# Patient Record
Sex: Female | Born: 2000 | Race: White | Hispanic: No | Marital: Single | Smoking: Never smoker
Health system: Southern US, Community
[De-identification: ages and names within clinical notes are randomized; demographics above are authoritative.]

## PROBLEM LIST (undated history)

## (undated) DIAGNOSIS — K529 Noninfective gastroenteritis and colitis, unspecified: Secondary | ICD-10-CM

## (undated) HISTORY — DX: Noninfective gastroenteritis and colitis, unspecified: K52.9

## (undated) HISTORY — PX: KNEE SURGERY: SHX244

---
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
## 2022-04-08 IMAGING — CT CT RENAL STONE PROTOCOL
2 of 4 series · 15 of 46 positions shown, 17 images · non-contrast
Comparison: Chest radiographs 02/11/2018.

CLINICAL DATA: 19-year-old female with left side abdominal and
flank pain beginning 2 days ago. Recently tested negative for
[id number] and strep.

EXAM:
CT ABDOMEN AND PELVIS WITHOUT CONTRAST
TECHNIQUE: Multidetector CT imaging of the abdomen and pelvis was performed
following the standard protocol without IV contrast.

[Series 2: axial st · axial · 0.57mm/px · z∈[-765,-400]mm · 12 of 84 slices shown, 14 images]
[im 7/84  soft-tissue]
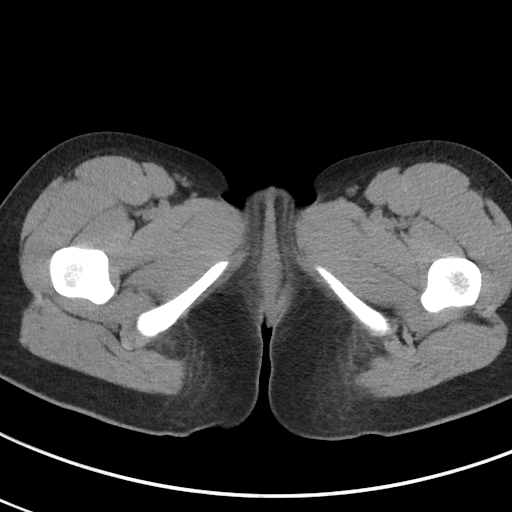
[im 7/84  bone]
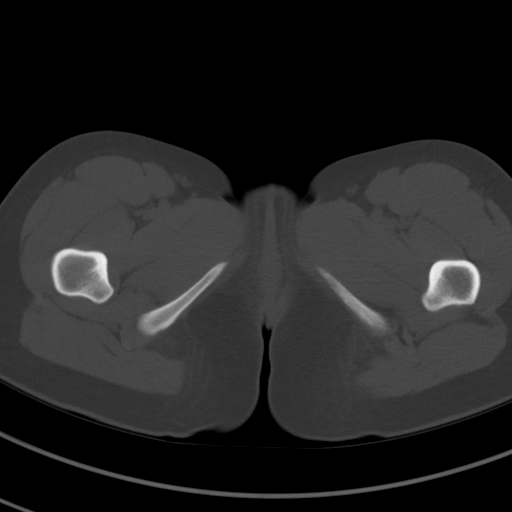
[im 14/84  soft-tissue]
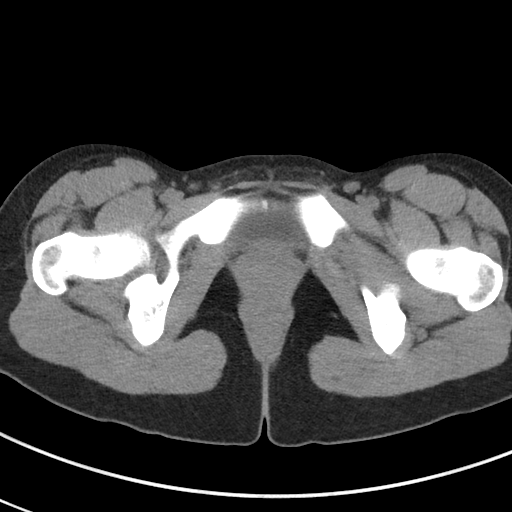
[im 20/84  soft-tissue]
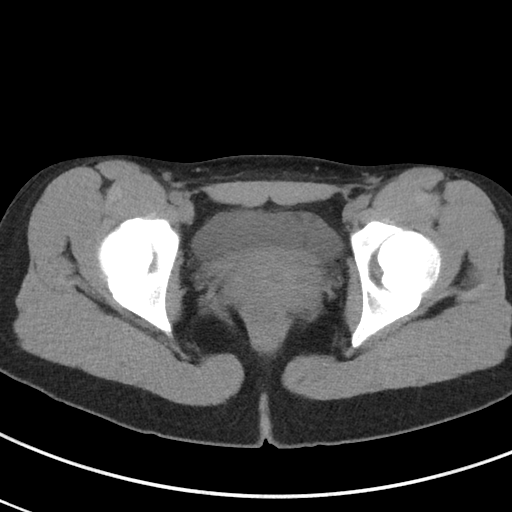
[im 27/84  soft-tissue]
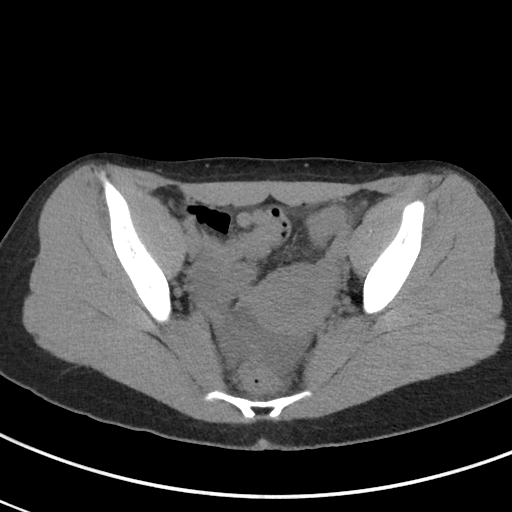
[im 34/84  soft-tissue]
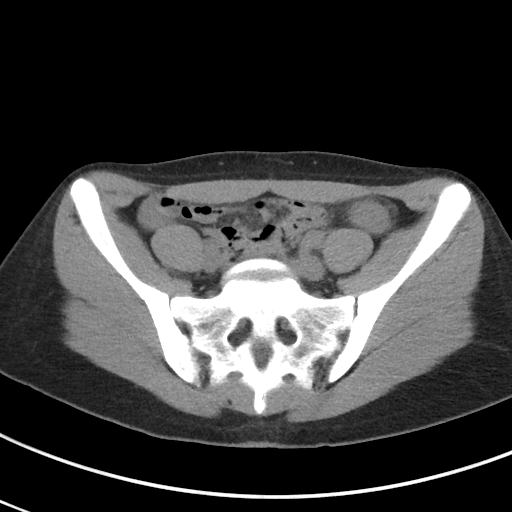
[im 40/84  soft-tissue]
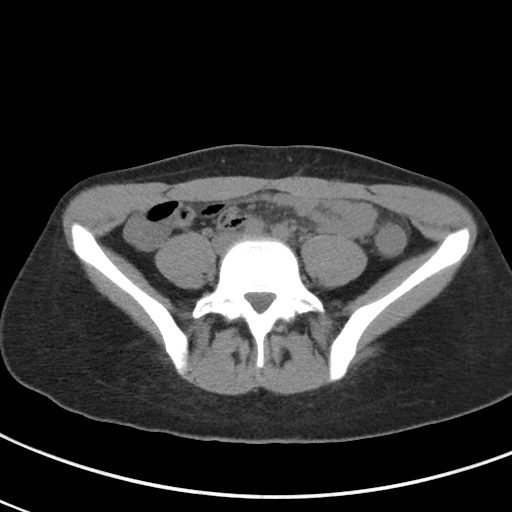
[im 47/84  soft-tissue]
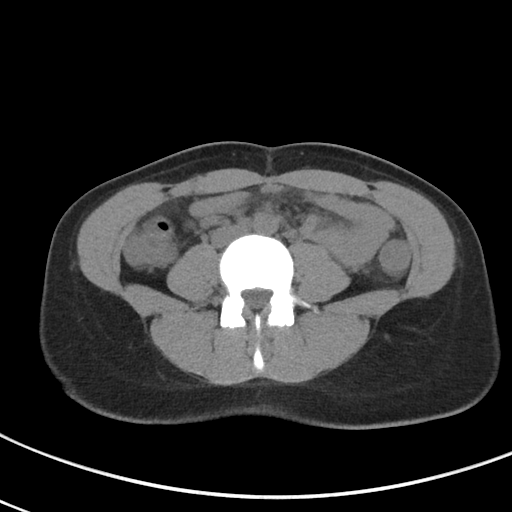
[im 54/84  soft-tissue]
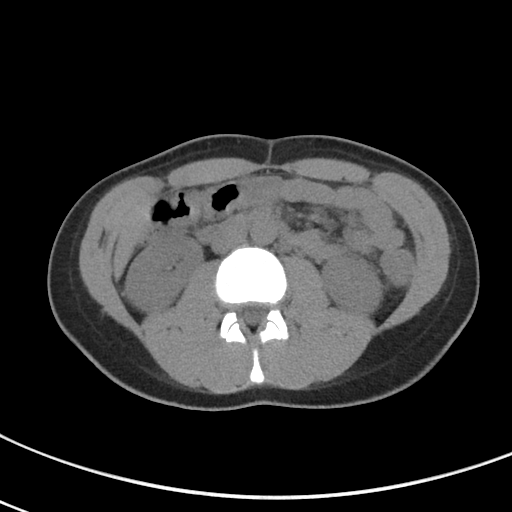
[im 60/84  soft-tissue]
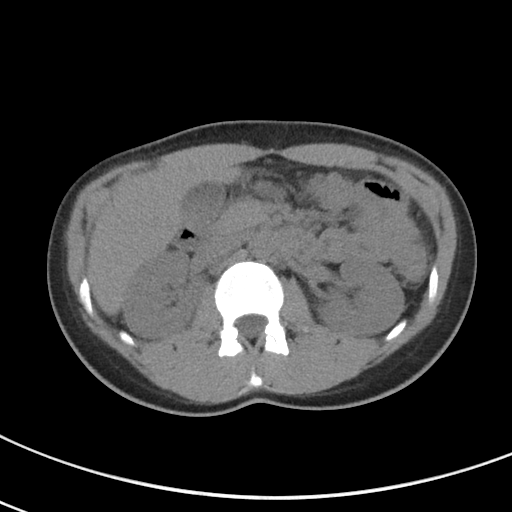
[im 60/84  bone]
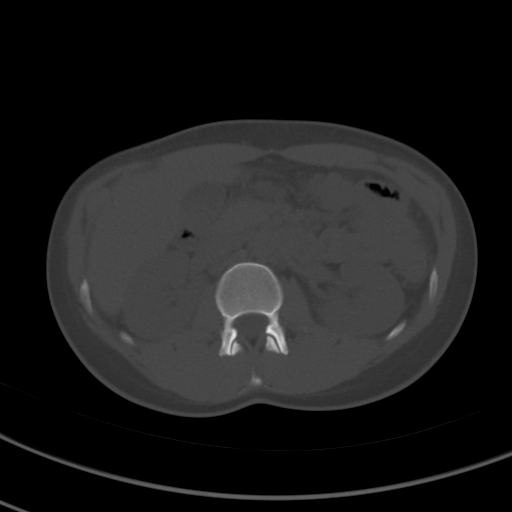
[im 67/84  soft-tissue]
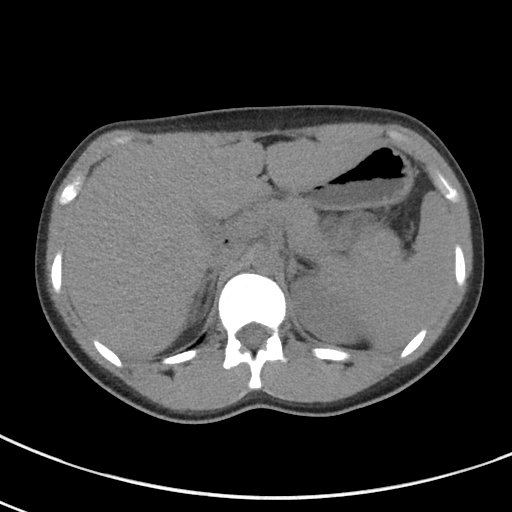
[im 74/84  soft-tissue]
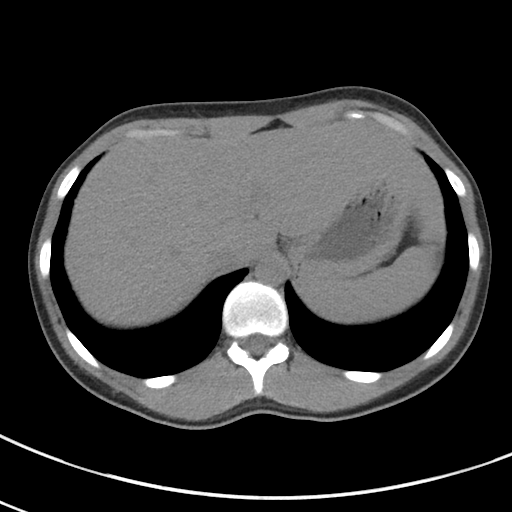
[im 80/84  soft-tissue]
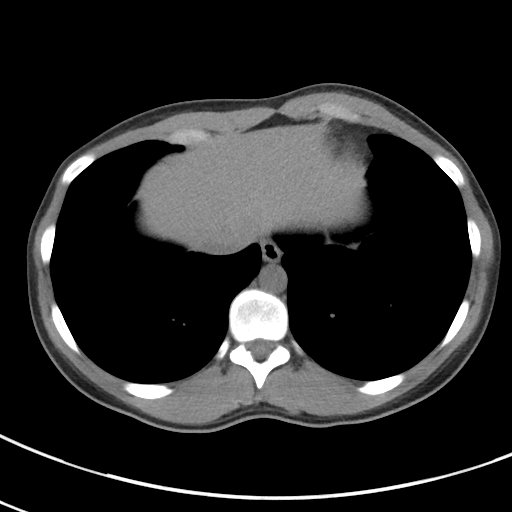

[Series 4: coronal st · coronal · 0.62mm/px · 3 of 85 slices shown]
[im 29/85  soft-tissue]
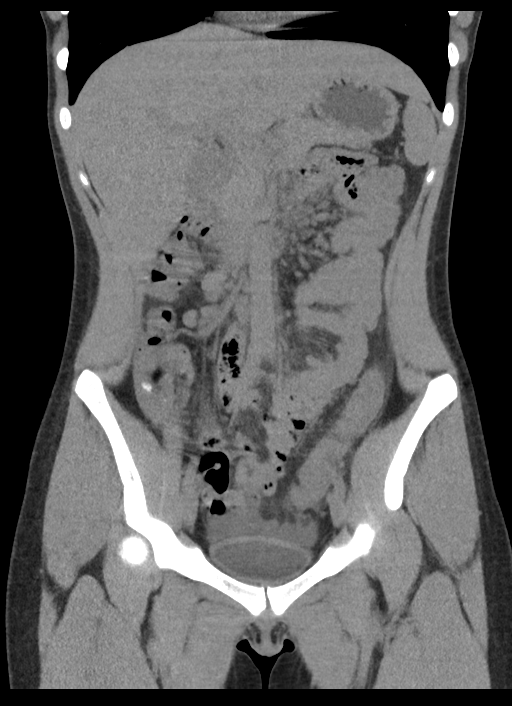
[im 38/85  soft-tissue]
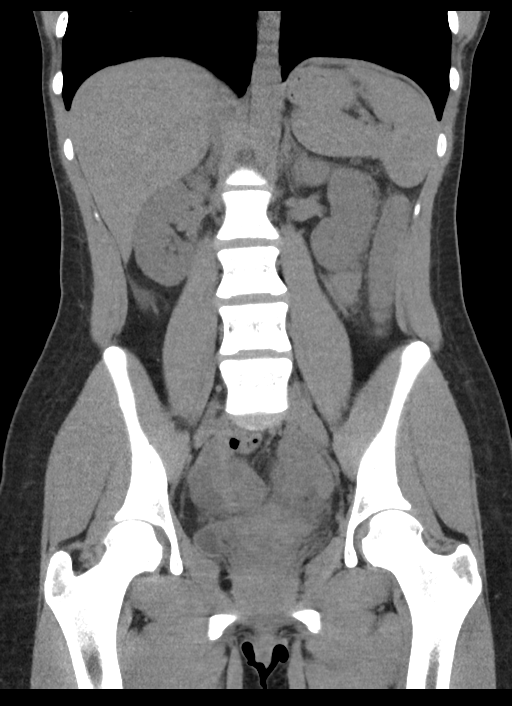
[im 47/85  soft-tissue]
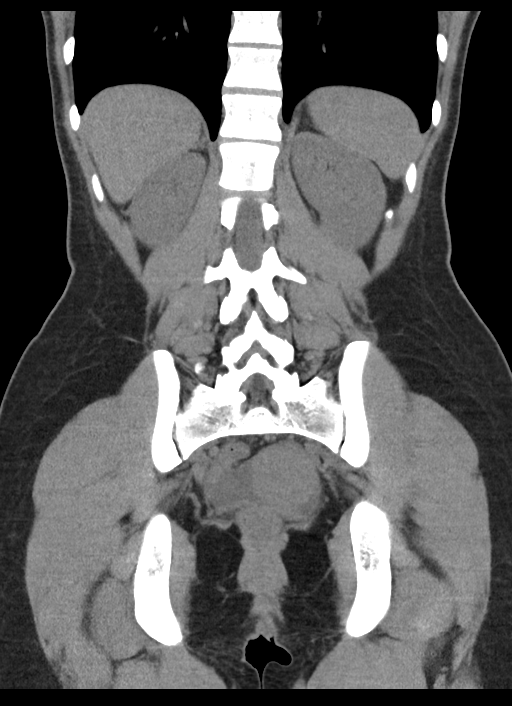

[15 of 46 positions shown; findings below may reference images not displayed]

FINDINGS: Lower chest: Negative.

Hepatobiliary: Negative noncontrast liver, gallbladder.

Pancreas: Negative noncontrast pancreas.

Spleen: Negative.

Adrenals/Urinary Tract: Normal adrenal glands. Noncontrast kidneys
appears symmetric and within normal limits. No nephrolithiasis. No
hydronephrosis. Proximal ureters appear decompressed. No urinary
calculus identified. Diminutive urinary bladder.

Stomach/Bowel: Decompressed rectum and sigmoid colon. The transverse
and descending colon are also decompressed, although appears
somewhat indistinct with suggestion of mild mesenteric inflammation
along the descending colon (see coronal images 27 through 36).

The right colon has a more normal appearance but also largely
decompressed. The terminal ileum is within normal limits. The
appendix is not delineated. No pericecal inflammation is identified.

No dilated small bowel. Stomach and duodenum appear negative. No
free air. No free fluid in the abdomen.

Vascular/Lymphatic: Normal caliber aorta. Vascular patency is not
evaluated in the absence of IV contrast. No atherosclerosis.

Reproductive: Negative noncontrast appearance.

Other: Small to moderate volume of simple fluid density pelvic free
fluid (series 2, image 59).

Musculoskeletal: Negative.
IMPRESSION: 1. Appearance on this noncontrast study suspicious for Acute
Descending Colitis.
No associated bowel obstruction or other complicating features;
there is a moderate volume of pelvic free fluid but that could be
physiologic.

2. No other abnormality is identified.
